# Patient Record
Sex: Male | Born: 1965 | Race: White | Hispanic: No | Marital: Single | Smoking: Never smoker
Health system: Southern US, Community
[De-identification: ages and names within clinical notes are randomized; demographics above are authoritative.]

## PROBLEM LIST (undated history)

## (undated) DIAGNOSIS — F32A Depression, unspecified: Secondary | ICD-10-CM

## (undated) DIAGNOSIS — F99 Mental disorder, not otherwise specified: Secondary | ICD-10-CM

## (undated) DIAGNOSIS — J9383 Other pneumothorax: Secondary | ICD-10-CM

## (undated) DIAGNOSIS — F329 Major depressive disorder, single episode, unspecified: Secondary | ICD-10-CM

## (undated) DIAGNOSIS — J9811 Atelectasis: Secondary | ICD-10-CM

## (undated) DIAGNOSIS — F419 Anxiety disorder, unspecified: Secondary | ICD-10-CM

## (undated) HISTORY — PX: LUNG SURGERY: SHX703

## (undated) HISTORY — DX: Other pneumothorax: J93.83

## (undated) HISTORY — DX: Atelectasis: J98.11

---

## 2000-05-26 ENCOUNTER — Emergency Department (HOSPITAL_COMMUNITY): Admission: EM | Admit: 2000-05-26 | Discharge: 2000-05-26 | Payer: Self-pay | Admitting: Emergency Medicine

## 2000-05-26 ENCOUNTER — Encounter: Payer: Self-pay | Admitting: Emergency Medicine

## 2001-07-10 ENCOUNTER — Encounter: Payer: Self-pay | Admitting: Emergency Medicine

## 2001-07-10 ENCOUNTER — Emergency Department (HOSPITAL_COMMUNITY): Admission: EM | Admit: 2001-07-10 | Discharge: 2001-07-10 | Payer: Self-pay | Admitting: Emergency Medicine

## 2001-07-11 ENCOUNTER — Ambulatory Visit (HOSPITAL_BASED_OUTPATIENT_CLINIC_OR_DEPARTMENT_OTHER): Admission: RE | Admit: 2001-07-11 | Discharge: 2001-07-12 | Payer: Self-pay | Admitting: *Deleted

## 2005-01-13 ENCOUNTER — Emergency Department (HOSPITAL_COMMUNITY): Admission: EM | Admit: 2005-01-13 | Discharge: 2005-01-14 | Payer: Self-pay | Admitting: Emergency Medicine

## 2005-02-13 ENCOUNTER — Emergency Department (HOSPITAL_COMMUNITY): Admission: EM | Admit: 2005-02-13 | Discharge: 2005-02-13 | Payer: Self-pay | Admitting: Emergency Medicine

## 2005-03-16 ENCOUNTER — Emergency Department (HOSPITAL_COMMUNITY): Admission: EM | Admit: 2005-03-16 | Discharge: 2005-03-16 | Payer: Self-pay | Admitting: Emergency Medicine

## 2005-03-20 ENCOUNTER — Ambulatory Visit (HOSPITAL_COMMUNITY): Admission: RE | Admit: 2005-03-20 | Discharge: 2005-03-20 | Payer: Self-pay | Admitting: Urology

## 2005-03-24 ENCOUNTER — Emergency Department (HOSPITAL_COMMUNITY): Admission: EM | Admit: 2005-03-24 | Discharge: 2005-03-24 | Payer: Self-pay | Admitting: Emergency Medicine

## 2005-06-03 ENCOUNTER — Emergency Department (HOSPITAL_COMMUNITY): Admission: EM | Admit: 2005-06-03 | Discharge: 2005-06-03 | Payer: Self-pay | Admitting: Emergency Medicine

## 2006-01-13 ENCOUNTER — Emergency Department (HOSPITAL_COMMUNITY): Admission: EM | Admit: 2006-01-13 | Discharge: 2006-01-13 | Payer: Self-pay | Admitting: Emergency Medicine

## 2007-11-24 IMAGING — CT CT PELVIS W/O CM
3 of 4 series · 14 of 32 positions shown, 19 images · IV contrast (agent unspecified)
Comparison: 01/13/05.

CLINICAL DATA: Abdominopelvic pain and left flank pain.  History of urinary calculi. 
ABDOMEN CT WITHOUT CONTRAST:
TECHNIQUE: Multidetector CT imaging of the abdomen was performed following the standard protocol without IV contrast.
TECHNIQUE: Multidetector CT imaging of the pelvis was performed following the standard protocol without IV contrast.

[Series 2: routine abdomen · axial · 0.70mm/px · z∈[-453,-223]mm · 4 of 78 slices shown, 9 images]
[im 16/78  soft-tissue]
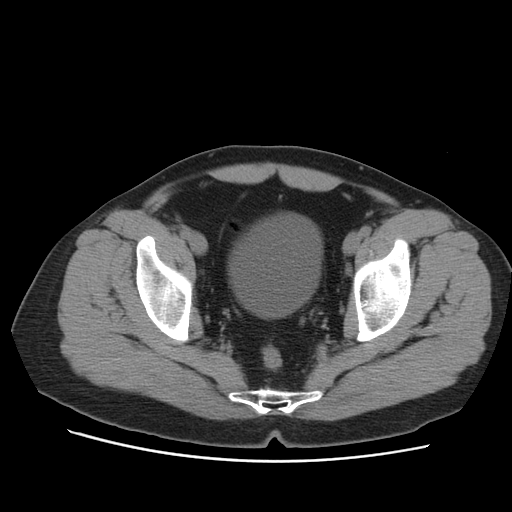
[im 16/78  lung]
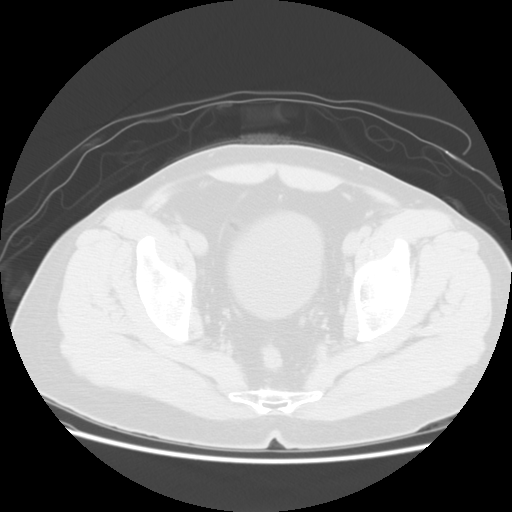
[im 16/78  bone]
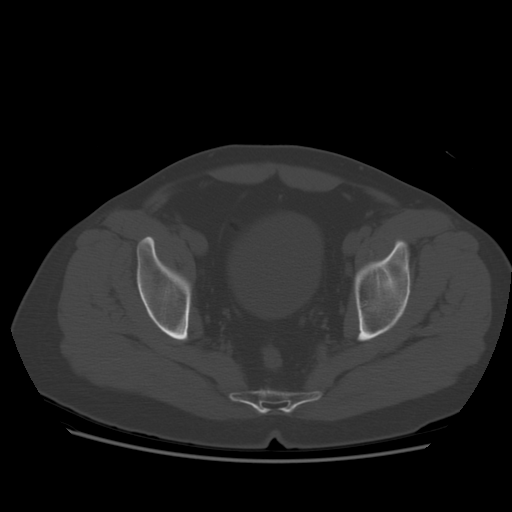
[im 31/78  soft-tissue]
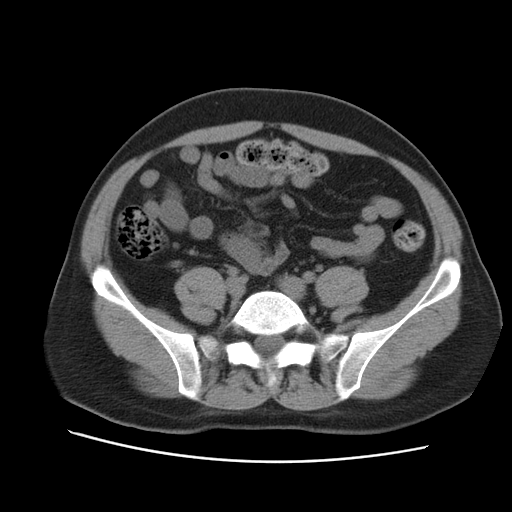
[im 31/78  lung]
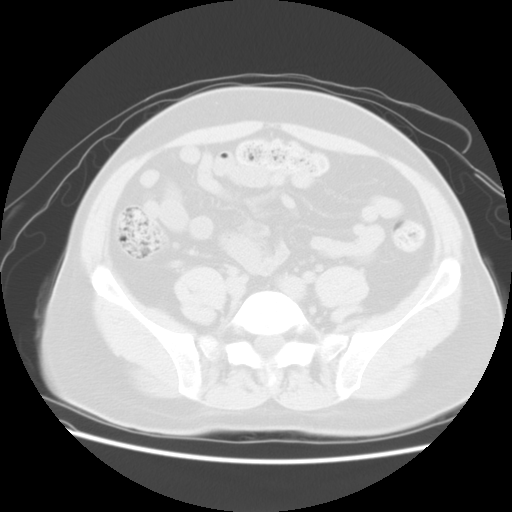
[im 47/78  soft-tissue]
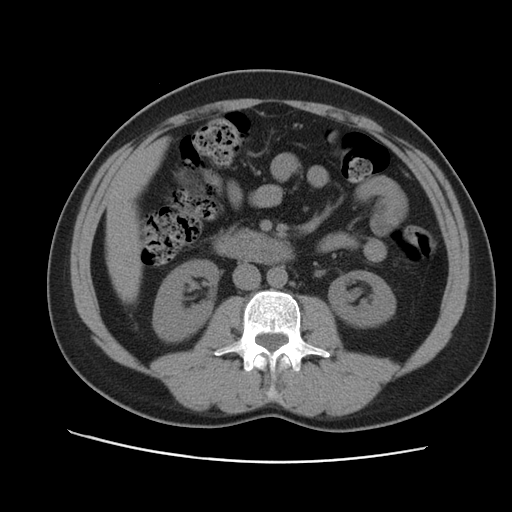
[im 47/78  lung]
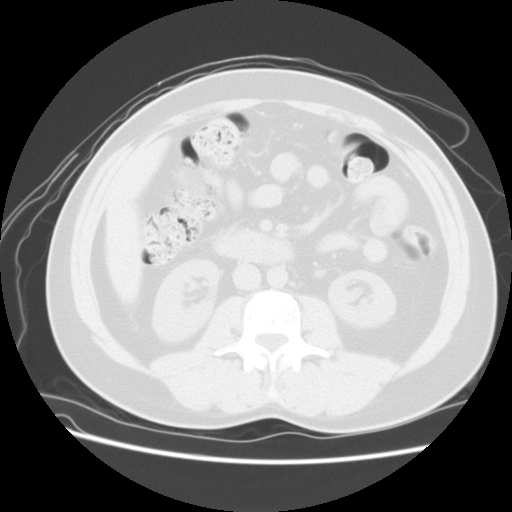
[im 62/78  soft-tissue]
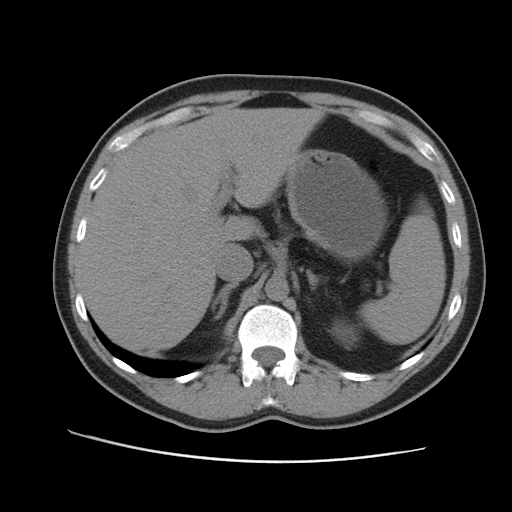
[im 62/78  lung]
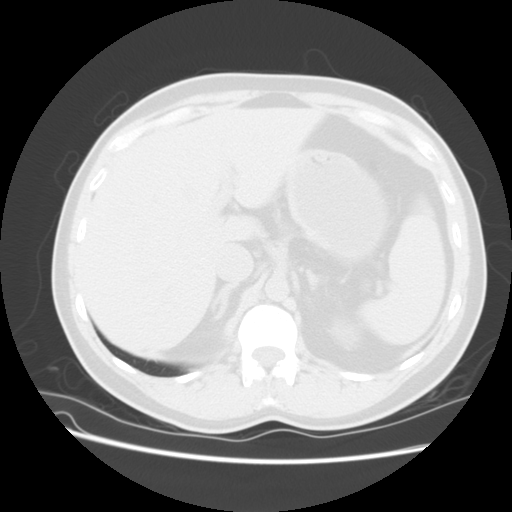

[Series 103: reformatted · sagittal · 0.80mm/px · 8 of 163 slices shown (1 of 2)]
[im 14/163  soft-tissue]
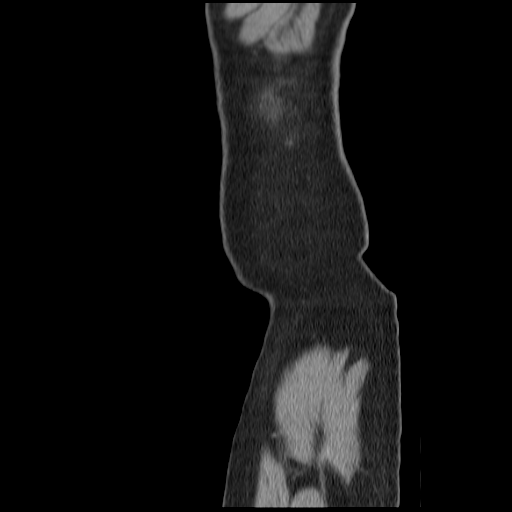
[im 41/163  soft-tissue]
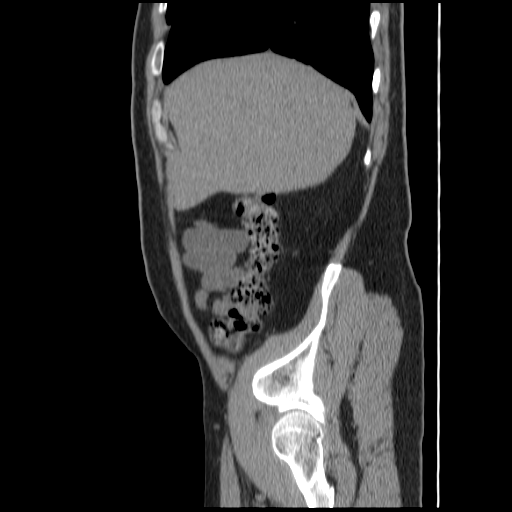
[im 55/163  soft-tissue]
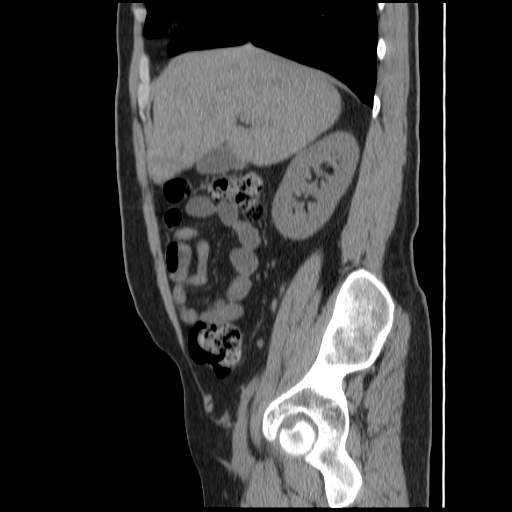
[im 68/163  soft-tissue]
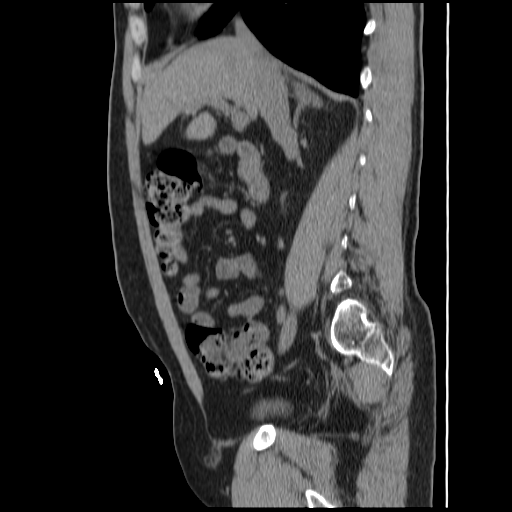
[im 95/163  soft-tissue]
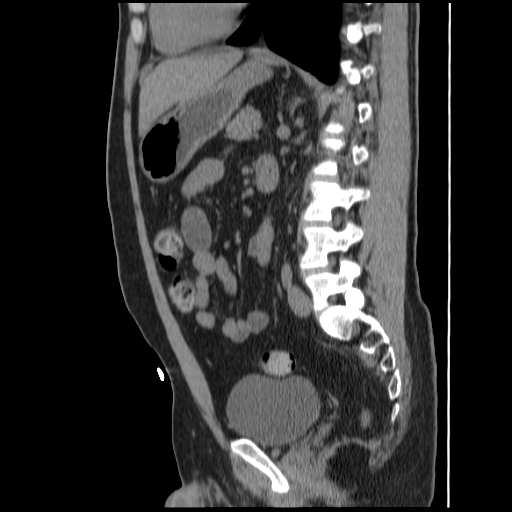
[im 109/163  soft-tissue]
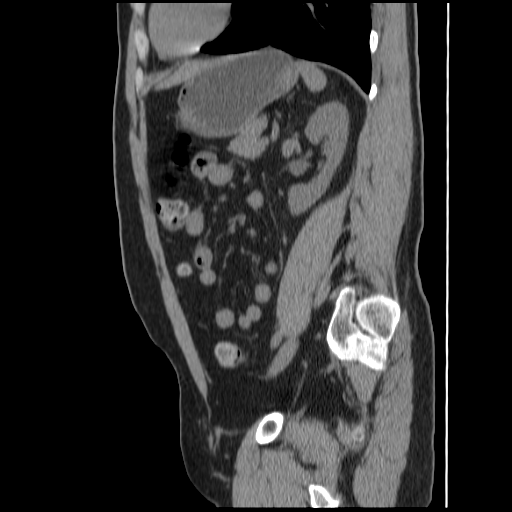
[im 122/163  soft-tissue]
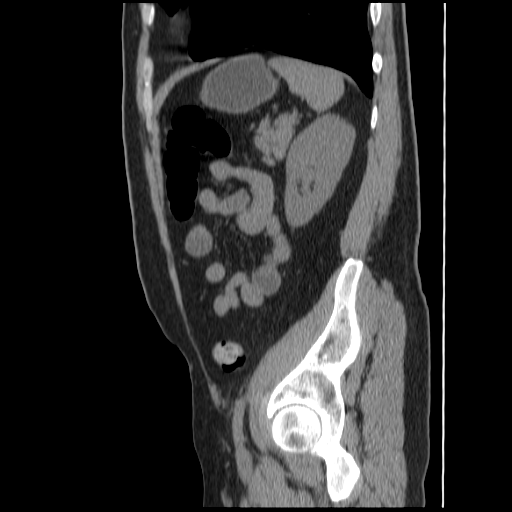
[im 149/163  soft-tissue]
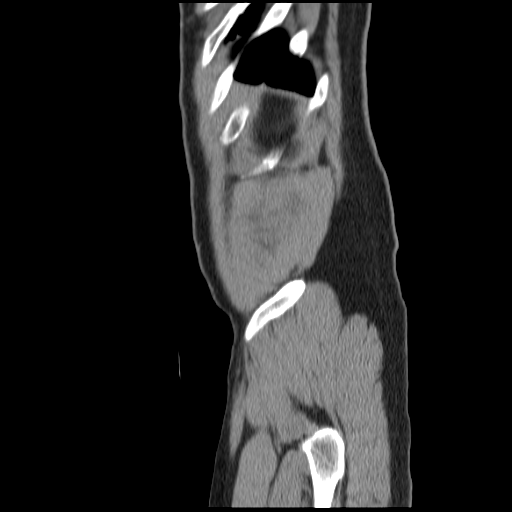

[Series 104: reformatted · coronal · 0.80mm/px · 2 of 138 slices shown (2 of 2)]
[im 14/138  soft-tissue]
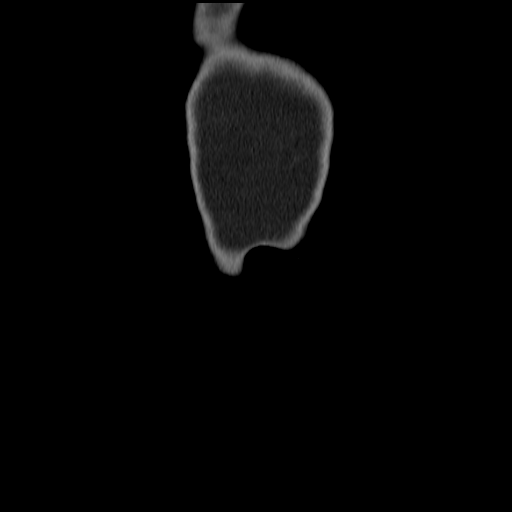
[im 28/138  soft-tissue]
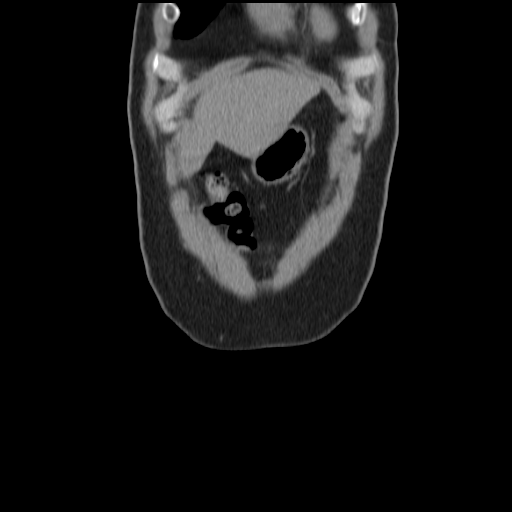

[14 of 32 positions shown; findings below may reference images not displayed]

FINDINGS: Hepatic cysts are unchanged, and the remainder of the liver is unremarkable.  The spleen, pancreas, adrenal glands, gallbladder, and right kidney are unremarkable.  Slightly increased mild left hydronephrosis is caused by a 3.5-mm calculus in the mid/distal left ureter just distal to the iliac crossover. This calculus has progressed distally from the left UPJ since the last study.  The remainder of the left kidney is unremarkable.  No free fluid, enlarged lymph nodes, abdominal aortic aneurysm, or biliary dilatation.  The appendix and bowel are unremarkable.
IMPRESSION: 3.5-mm mid/distal left ureteral calculus, progressed distally from the left UPJ since the last study, with increasing mild left hydronephrosis.
PELVIS CT WITHOUT CONTRAST:
FINDINGS: 3.5-mm mid/distal left ureteral calculus just distal to the iliac crossover noted.  No other significant abnormalities at present.  No free fluid or enlarged lymph nodes.  The bladder and bowel are within normal limits.
IMPRESSION: 3.5-mm mid/distal left ureteral calculus causing mild left hydronephrosis.

## 2009-01-04 ENCOUNTER — Ambulatory Visit: Payer: Self-pay | Admitting: Emergency Medicine

## 2009-01-04 DIAGNOSIS — J939 Pneumothorax, unspecified: Secondary | ICD-10-CM | POA: Insufficient documentation

## 2009-01-04 DIAGNOSIS — J930 Spontaneous tension pneumothorax: Secondary | ICD-10-CM

## 2009-01-04 DIAGNOSIS — R079 Chest pain, unspecified: Secondary | ICD-10-CM | POA: Insufficient documentation

## 2009-01-04 DIAGNOSIS — J9819 Other pulmonary collapse: Secondary | ICD-10-CM | POA: Insufficient documentation

## 2009-01-10 ENCOUNTER — Telehealth: Payer: Self-pay | Admitting: Emergency Medicine

## 2009-01-21 ENCOUNTER — Telehealth: Payer: Self-pay | Admitting: Emergency Medicine

## 2010-03-30 ENCOUNTER — Encounter: Payer: Self-pay | Admitting: Emergency Medicine

## 2010-04-11 ENCOUNTER — Ambulatory Visit (HOSPITAL_COMMUNITY)
Admission: RE | Admit: 2010-04-11 | Discharge: 2010-04-11 | Disposition: A | Discharge status: Home / Self Care | Payer: 59 | Source: Ambulatory Visit | Attending: Psychiatry | Admitting: Psychiatry

## 2010-04-11 DIAGNOSIS — F410 Panic disorder [episodic paroxysmal anxiety] without agoraphobia: Secondary | ICD-10-CM | POA: Insufficient documentation

## 2010-04-15 ENCOUNTER — Other Ambulatory Visit (HOSPITAL_COMMUNITY): Payer: 59 | Attending: Psychiatry | Admitting: Psychiatry

## 2010-04-15 DIAGNOSIS — F429 Obsessive-compulsive disorder, unspecified: Secondary | ICD-10-CM | POA: Insufficient documentation

## 2010-04-15 DIAGNOSIS — K219 Gastro-esophageal reflux disease without esophagitis: Secondary | ICD-10-CM | POA: Insufficient documentation

## 2010-04-15 DIAGNOSIS — F410 Panic disorder [episodic paroxysmal anxiety] without agoraphobia: Secondary | ICD-10-CM | POA: Insufficient documentation

## 2010-04-15 DIAGNOSIS — K589 Irritable bowel syndrome without diarrhea: Secondary | ICD-10-CM | POA: Insufficient documentation

## 2010-04-15 DIAGNOSIS — H409 Unspecified glaucoma: Secondary | ICD-10-CM | POA: Insufficient documentation

## 2010-04-15 DIAGNOSIS — F319 Bipolar disorder, unspecified: Secondary | ICD-10-CM

## 2010-04-16 ENCOUNTER — Other Ambulatory Visit (HOSPITAL_COMMUNITY): Payer: 59 | Admitting: Psychiatry

## 2010-04-17 ENCOUNTER — Other Ambulatory Visit (HOSPITAL_COMMUNITY): Payer: 59 | Admitting: Psychiatry

## 2010-04-18 ENCOUNTER — Other Ambulatory Visit (HOSPITAL_COMMUNITY): Payer: 59 | Admitting: Psychiatry

## 2010-04-21 ENCOUNTER — Other Ambulatory Visit (HOSPITAL_COMMUNITY): Payer: 59 | Admitting: Psychiatry

## 2010-04-22 ENCOUNTER — Other Ambulatory Visit (HOSPITAL_COMMUNITY): Payer: 59 | Admitting: Psychiatry

## 2010-04-23 ENCOUNTER — Other Ambulatory Visit (HOSPITAL_COMMUNITY): Payer: 59 | Admitting: Psychiatry

## 2010-04-24 ENCOUNTER — Other Ambulatory Visit (HOSPITAL_COMMUNITY): Payer: 59 | Admitting: Psychiatry

## 2010-04-25 ENCOUNTER — Other Ambulatory Visit (HOSPITAL_COMMUNITY): Payer: 59 | Admitting: Psychiatry

## 2010-04-28 ENCOUNTER — Other Ambulatory Visit (HOSPITAL_COMMUNITY): Payer: 59 | Admitting: Psychiatry

## 2010-04-29 ENCOUNTER — Other Ambulatory Visit (HOSPITAL_COMMUNITY): Payer: 59 | Admitting: Psychiatry

## 2010-04-30 ENCOUNTER — Other Ambulatory Visit (HOSPITAL_COMMUNITY): Payer: 59 | Admitting: Psychiatry

## 2010-05-01 ENCOUNTER — Other Ambulatory Visit (HOSPITAL_COMMUNITY): Payer: 59 | Admitting: Psychiatry

## 2010-05-02 ENCOUNTER — Other Ambulatory Visit (HOSPITAL_COMMUNITY): Payer: 59 | Admitting: Psychiatry

## 2010-05-05 ENCOUNTER — Other Ambulatory Visit (HOSPITAL_COMMUNITY): Payer: 59 | Admitting: Psychiatry

## 2010-07-25 NOTE — Op Note (Signed)
[place]. [facility]  Patient:    BRASON, BERTHELOT Visit Number: [id number] MRN: [id number]          Service Type: EMS Location: ED Attending Physician:  Hanley Seamen Dictated by:   Lowell Bouton, M.D. Proc. Date: 07/11/01 Admit Date:  07/10/2001 Discharge Date: 07/10/2001                             Operative Report  PREOPERATIVE DIAGNOSIS:  Comminuted intra-articular fracture, right distal radius.  POSTOPERATIVE DIAGNOSIS:  Comminuted intra-articular fracture, right distal radius.  OPERATION PERFORMED:  Open reduction internal fixation, right distal radius fracture.  SURGEON:  Lowell Bouton, M.D.  ANESTHESIA:  Axillary block.  OPERATIVE FINDINGS:  The patient had significant dorsal comminution with intra-articular component volarly in the sagittal plane.  The radial tilt had been flattened and there was a neutral dorsal tilt.  DESCRIPTION OF PROCEDURE:  Under axillary block anesthesia, with a tourniquet on the right arm, the right hand was prepped and draped in the usual fashion and after exsanguinating the limb, the tourniquet was inflated to 250 mHg.  A volar approach was made to the distal radius with a longitudinal incision along the flexor carpi radialis tendon.  The incision was obliqued across the volar wrist crease.  Sharp dissection was carried through the subcutaneous tissues and bleeding points were coagulated.  Sharp dissection was carried through the FCR sheath and the FCR tendon was retracted ulnarly.  The floor of the sheath was opened longitudinally with the scissors.  There was significant pressure beneath the fascia.  This was released proximally and distally down to the pronator quadratus. That had been torn off the radial attachment of the radius.  It was elevated ulnarly.  The fracture was exposed and 10 pounds of longitudinal traction had been applied across the radial three digits with finger  traps.  The fracture was irrigated with saline.  There was a large radial styloid fragment that was comminuted and then there was an ulnar fragment that was fairly large.  These two fragments were reduced volarly. The styloid fragments was pinned temporarily with a 45 K-wire to hold the reduction.  A DVR plate was placed volarly on the distal radius and x-rays showed good alignment of the fracture.  The plate was applied by putting a 3.5 screw in proximally followed by four nonthreaded 2.5 pegs.  This corrected the dorsal tilt.  The remaining three 3.5 screws were then inserted proximally and the K-wire was removed.  The traction had been removed prior to inserting the pegs.  X-ray showed good alignment of the fracture.  Under fluoroscopy there was no further subluxation of the carpus.  There was stability of the fracture fragments.  The wound was irrigated with saline.  The vessel loop drain was left in for drainage.  The pronator quadratus was reattached with 4-0 Vicryl and the subcutaneous tissues were closed with 4-0 Vicryl.  The skin was closed with a 3-0 subcuticular Prolene.  Steri-Strips were applied followed by sterile dressings and a sugar-tong splint.  The patient tolerated the procedure well and went to the recovery room awake and stable in good condition. Dictated by:   Lowell Bouton, M.D. Attending Physician:  Hanley Seamen DD:  07/11/01 TD:  07/12/01 Job: [id number]

## 2010-07-25 NOTE — Op Note (Signed)
NAME:  Peter Gardner, Peter Gardner              ACCOUNT NO.:  [id number]   MEDICAL RECORD NO.:  [id number]          PATIENT TYPE:  AMB   LOCATION:  DAY                          FACILITY:  [facility]   PHYSICIAN:  Sigmund I. Patsi Sears, M.D.DATE OF BIRTH:  [birth year]   DATE OF PROCEDURE:  03/20/2005  DATE OF DISCHARGE:                                 OPERATIVE REPORT   PREOPERATIVE DIAGNOSIS:  Impacted left EV junction stone 3.7 mm.   POSTOPERATIVE DIAGNOSIS:  Impacted left EV junction stone 3.7 mm.   OPERATION:  Cystourethroscopy, left retrograde pyelogram with  interpretation, balloon dilation of left lower ureter, ureteroscopy with  basket extraction of left ureteral stone, left double-J catheter.   SURGEON:  Dr. Patsi Sears   ANESTHESIA:  General LMA.   PREOPERATIVE PREPARATION:  Appropriate preanesthesia, the patient is brought  to the operating room and placed on the operating room in the dorsal supine  position where general LMA anesthesia was introduced.  He was then re-placed  in dorsal lithotomy position where the pubis was prepped with Betadine  solution and draped in usual fashion.   REVIEW OF HISTORY:  This 45 year old male has a history of recurrent left  ureteral colic, and currently presents with crescendo colic for basket  extraction of stone this afternoon.   PROCEDURE:  Cystourethroscopy was accomplished, and the left ureteral  orifice was noted to be markedly edematous and deformed.  The 5-French open-  ended catheter is manipulated into the orifice, but a wire could not be  passed around the stone.  Retrograde pyelogram is performed, shows  obstruction of the ureter.   The ureteroscope was passed into the ureter, and the stone was pushed up  proximalward.  An area of impaction of the UV junction is edematous,  erythematous, freely bleeding, and very thin-walled from chronic reaction.   A guidewire is then passed into the kidney.  The ureteroscope was removed,  and the  cystoscope again placed, and 4-cm balloon dilator is placed, and the  lower ureter and orifice are balloon dilated for 3 minutes to 14 atmospheres  pressure.  Following this, repeat ureteroscopy was accomplished; the stone  is identified and the basket is placed.  However, the stone would not come  out.  The stone was shaped in an arrow or comma-shaped and was particularly  difficult to entrap in the basket.  It was elected to place the laser, and  the basket was cut and  clamped.  While preparing the laser, the basket with  the stone entrapped fell out, with the stone in the basket.  Therefore, the  laser was not used.   Repeat ureteroscopy was accomplished and showed that there was bleeding from  the lower ureter, but the mid ureter upper ureter appeared to be normal.  It  was elected to leave a double-J catheter, and the ureteroscope was replaced  with a cystoscope.  A 5-French ureteral catheter was then placed [facility]), under fluoroscopic control.  The patient tolerated the  procedure well.  Stone was taken.  He was awakened and taken to the recovery  room in  good condition.  He received IV Toradol.      Sigmund I. Patsi Sears, M.D.  Electronically Signed     SIT/MEDQ  D:  03/20/2005  T:  03/21/2005  Job:  [id number]

## 2010-12-08 ENCOUNTER — Ambulatory Visit (INDEPENDENT_AMBULATORY_CARE_PROVIDER_SITE_OTHER)
Admission: RE | Admit: 2010-12-08 | Discharge: 2010-12-08 | Disposition: A | Discharge status: Home / Self Care | Payer: Managed Care, Other (non HMO) | Source: Ambulatory Visit | Attending: Adult Health | Admitting: Adult Health

## 2010-12-08 ENCOUNTER — Ambulatory Visit (INDEPENDENT_AMBULATORY_CARE_PROVIDER_SITE_OTHER): Payer: Managed Care, Other (non HMO) | Admitting: Adult Health

## 2010-12-08 ENCOUNTER — Encounter: Payer: Self-pay | Admitting: Adult Health

## 2010-12-08 VITALS — BP 114/70 | HR 79 | Temp 97.9°F | Ht 68.0 in | Wt 139.0 lb

## 2010-12-08 DIAGNOSIS — R079 Chest pain, unspecified: Secondary | ICD-10-CM

## 2010-12-08 DIAGNOSIS — Z8709 Personal history of other diseases of the respiratory system: Secondary | ICD-10-CM

## 2010-12-08 NOTE — Patient Instructions (Signed)
Warm heat to ribs.  Motrin 200mg  3-4  tabs  Three times a day  With food for 5 days  Gas x As needed   Please contact office for sooner follow up if symptoms do not improve or worsen or seek emergency care  Follow up Dr. Delton Coombes in 6 weeks  And As needed

## 2010-12-09 NOTE — Progress Notes (Signed)
Subjective:    Patient ID: Peter Gardner, male    DOB: [birth year], 45 y.o.   MRN: [id number]  HPI 45 yo never smoker, has a hx of recurrent  bilateral pneumothoraces at age 57 that  ultimately required bilateral pleurodesis. Seen for initial pulmonary consult by Dr. Delton Coombes 10/ 2010.   12/2008 Initial pulmonary consult   hx of bilateral pneumothoraces at age 67 , ultimately required bilateral pleurodesis. Since then he has had other occurances of spont PTX, never requiring a repeat chest tube. Over the last few yrs he has been experiencing intermittant dull chest aching and pressure. Cardiac eval reassuring, doesn't feel like GERDOver the last month he has had more frequent L sided deep pain, like someone pushing on his chest. Not associated with cough, other specific activities. CXR 9/10 without any recurrance PTX, does show tenting of the R hemidiaphragm. No obvious calcifications  >>CT chest neg for acute process, neg for PE. Chronic changes of bilateral pleurodesis. Alpha 1 done was neg (MM)   Acute OV 12/08/2010 Pt c/o left side CP/Pressure in chest x 1 wk. States that the pain feels as if he has another pneumothorax.  Pt states "pain doubles me over" . Pt complains that since his surgery he has had recurent left sided chest pain along previous healed surgical scars. Today xray in the office was neg for acute process, no sign of Pneumothorax. He has no cough/fever or congestion  No exertional chest pain or palpitation.no syncope. Tenderness along left lateral ribs.  Has used some old vicodin with some relief.     Review of Systems Constitutional:   No  weight loss, night sweats,  Fevers, chills, fatigue, or  lassitude.  HEENT:   No headaches,  Difficulty swallowing,  Tooth/dental problems, or  Sore throat,                No sneezing, itching, ear ache, nasal congestion, post nasal drip,   CV:  No chest pain,  Orthopnea, PND, swelling in lower extremities, anasarca, dizziness,  palpitations, syncope.   GI  No heartburn, indigestion, abdominal pain, nausea, vomiting, diarrhea, change in bowel habits, loss of appetite, bloody stools.   Resp: No shortness of breath with exertion or at rest.  No excess mucus, no productive cough,  No non-productive cough,  No coughing up of blood.  No change in color of mucus.  No wheezing.  No chest wall deformity  Skin: no rash or lesions.  GU: no dysuria, change in color of urine, no urgency or frequency.  No flank pain, no hematuria   MS:  No joint pain or swelling.  No decreased range of motion.  No back pain.  Psych:  No change in mood or affect. No depression or anxiety.  No memory loss.         Objective:   Physical Exam GEN: A/Ox3; pleasant , NAD, well nourished   HEENT:  [place]/AT,  EACs-clear, TMs-wnl, NOSE-clear, THROAT-clear, no lesions, no postnasal drip or exudate noted.   NECK:  Supple w/ fair ROM; no JVD; normal carotid impulses w/o bruits; no thyromegaly or nodules palpated; no lymphadenopathy.  RESP  Clear  P & A; w/o, wheezes/ rales/ or rhonchi.no accessory muscle use, no dullness to percussion , along left lateral chest wall with well healed surgical scars, tenderness to touch, no redness or rash. No deformity noted.   CARD:  RRR, no m/r/g  , no peripheral edema, pulses intact, no cyanosis or clubbing.  GI:  Soft & nt; nml bowel sounds; no organomegaly or masses detected.  Musco: Warm bil, no deformities or joint swelling noted.   Neuro: alert, no focal deficits noted.    Skin: Warm, no lesions or rashes    CXR 12/08/10  Postop changes in both lung apices. Mild hyperinflation  noted with chronic basilar scarring. No airspace disease,  collapse, consolidation, edema, pneumonia, effusion, or  pneumothorax. Midline trachea. Normal heart size and vascularity.        Assessment & Plan:

## 2010-12-09 NOTE — Assessment & Plan Note (Signed)
Recurrent chronic chest wall pain from previous pleurodesis for recurrent pnuemothoraces  Xray with no evidence of pneumothrorax  Suspect this is related to chronic pleural scarring +/- surgical scar tissue Advised to use heat, nsaids As needed   If continues might consider neurontin for chronic pain

## 2011-01-09 ENCOUNTER — Encounter: Payer: Self-pay | Admitting: Emergency Medicine

## 2011-01-09 ENCOUNTER — Inpatient Hospital Stay (INDEPENDENT_AMBULATORY_CARE_PROVIDER_SITE_OTHER)
Admission: RE | Admit: 2011-01-09 | Discharge: 2011-01-09 | Disposition: A | Discharge status: Home / Self Care | Payer: Managed Care, Other (non HMO) | Source: Ambulatory Visit | Attending: Emergency Medicine | Admitting: Emergency Medicine

## 2011-01-09 DIAGNOSIS — N509 Disorder of male genital organs, unspecified: Secondary | ICD-10-CM

## 2011-01-12 ENCOUNTER — Ambulatory Visit
Admission: RE | Admit: 2011-01-12 | Discharge: 2011-01-12 | Disposition: A | Discharge status: Home / Self Care | Payer: Managed Care, Other (non HMO) | Source: Ambulatory Visit | Attending: Emergency Medicine | Admitting: Emergency Medicine

## 2011-01-12 ENCOUNTER — Other Ambulatory Visit: Payer: Self-pay | Admitting: Emergency Medicine

## 2011-01-12 DIAGNOSIS — N50812 Left testicular pain: Secondary | ICD-10-CM

## 2011-01-14 ENCOUNTER — Telehealth (INDEPENDENT_AMBULATORY_CARE_PROVIDER_SITE_OTHER): Payer: Self-pay | Admitting: Emergency Medicine

## 2011-02-09 NOTE — Telephone Encounter (Signed)
  Phone Note Call from Patient Call back at Home Phone [phone]   Caller: Patient Summary of Call: Patient having more pain, sent him to [facility], [phone], He has a small cyst and he needs furthur evaluation.

## 2011-02-09 NOTE — Progress Notes (Signed)
Summary: Left Testicular Pain Room 4   Vital Signs:  Patient Profile:   45 Years Old Male CC:      Left testicular pain x 1 month, denies new sexual partners, no discharge Height:     69 inches (175.26 cm) Weight:      138 pounds O2 Sat:      100 % O2 treatment:    Room Air Temp:     99.4 degrees F oral Pulse rate:   70 / minute Pulse rhythm:   regular Resp:     16 per minute BP sitting:   109 / 71  (left arm) Cuff size:   regular  Pt. in pain?   yes    Location:   left testicle    Intensity:   2    Type:       aching  Vitals Entered By: Emilio Math (January 09, 2011 5:01 PM)                   Current Allergies: ! PCNHistory of Present Illness Chief Complaint: Left testicular pain x 1 month, denies new sexual partners, no discharge History of Present Illness: L testicular pain for almost a month.  Same monogamous sexual partner for 15 years.  No dysuria, discharge, back pain.  Pain is soreness, constant, mild.  No F/C/N/V.  No other symptoms noted.  No swelling.  No relieving measures tried.  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  His dad did have a hernia.  REVIEW OF SYSTEMS Constitutional Symptoms      Denies fever, chills, night sweats, weight loss, weight gain, and fatigue.  Eyes       Denies change in vision, eye pain, eye discharge, glasses, contact lenses, and eye surgery. Ear/Nose/Throat/Mouth       Denies hearing loss/aids, change in hearing, ear pain, ear discharge, dizziness, frequent runny nose, frequent nose bleeds, sinus problems, sore throat, hoarseness, and tooth pain or bleeding.  Respiratory       Denies dry cough, productive cough, wheezing, shortness of breath, asthma, bronchitis, and emphysema/COPD.  Cardiovascular       Denies murmurs, chest pain, and tires easily with exhertion.    Gastrointestinal       Denies stomach pain, nausea/vomiting, diarrhea, constipation, blood in bowel movements, and  indigestion. Genitourniary       Denies painful urination, kidney stones, and loss of urinary control.      Comments: Left testicular pain Neurological       Denies paralysis, seizures, and fainting/blackouts. Musculoskeletal       Denies muscle pain, joint pain, joint stiffness, decreased range of motion, redness, swelling, muscle weakness, and gout.  Skin       Denies bruising, unusual mles/lumps or sores, and hair/skin or nail changes.  Psych       Denies mood changes, temper/anger issues, anxiety/stress, speech problems, depression, and sleep problems.  Past History:  Past Medical History: Reviewed history from 01/04/2009 and no changes required. Current Problems:  CHEST PAIN (ICD-786.50) Hx of ATELECTASIS (ICD-518.0)  Past Surgical History: Left lung surgery in 1984 Right lung surgery in 1985  Family History: Reviewed history from 01/04/2009 and no changes required. Family History MI/Heart Attack 00 father, MGM, MGF Family History C V A / Stroke --PGF Alzheimers -- PGM Heart disease -- mother and father ? 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 MSE: oriented to time, place, and person Assessment New Problems: TESTICULAR PAIN (ICD-608.9)   Plan New Medications/Changes: CIPROFLOXACIN HCL 500 MG TABS (CIPROFLOXACIN HCL) 1 by mouth two times a day for 8 days  #16 x 0, 01/09/2011, Hoyt Koch MD  New Orders: New Patient Level III [phone] Planning Comments:   DDx includes epidydimitis/orchitis -- will treat with Cipro + OTC antiinflammatory, ice, elevation.  Consider neoplasm, varicocele, etc -- to order an U/S with doppler.  I don't see any red flags to say this is testicular torsion, especially with a month of mild pain.  Need to  consider cancer with history of undescended L testicle.  Patient understand and agrees to this course.  ER precautions explained for severe pain.  I also don't believe this is urinary, STD, kidney stone, etc, but may want to consider checking if not improving.  Also may want to consider indirect L inguinal hernia.  If all tests negative and not improving, will likely send to urology for more definitive workup.   The patient and/or caregiver has been counseled thoroughly with regard to medications prescribed including dosage, schedule, interactions, rationale for use, and possible side effects and they verbalize understanding.  Diagnoses and expected course of recovery discussed and will return if not improved as expected or if the condition worsens. Patient and/or caregiver verbalized understanding.  Prescriptions: CIPROFLOXACIN HCL 500 MG TABS (CIPROFLOXACIN HCL) 1 by mouth two times a day for 8 days  #16 x 0   Entered and Authorized by:   Hoyt Koch MD   Signed by:   Hoyt Koch MD on 01/09/2011   Method used:   Print then Give to Patient   RxID:   [id number]   Orders Added: 1)  New Patient Level III [96295]

## 2012-03-28 ENCOUNTER — Encounter (HOSPITAL_COMMUNITY): Payer: Self-pay | Admitting: *Deleted

## 2012-03-28 ENCOUNTER — Ambulatory Visit (HOSPITAL_COMMUNITY)
Admission: RE | Admit: 2012-03-28 | Discharge: 2012-03-28 | Disposition: A | Discharge status: Home / Self Care | Payer: Self-pay | Attending: Psychiatry | Admitting: Psychiatry

## 2012-03-28 DIAGNOSIS — F411 Generalized anxiety disorder: Secondary | ICD-10-CM | POA: Insufficient documentation

## 2012-03-28 HISTORY — DX: Anxiety disorder, unspecified: F41.9

## 2012-03-28 HISTORY — DX: Mental disorder, not otherwise specified: F99

## 2012-03-28 HISTORY — DX: Major depressive disorder, single episode, unspecified: F32.9

## 2012-03-28 HISTORY — DX: Depression, unspecified: F32.A

## 2012-03-28 NOTE — H&P (Signed)
Behavioral Health Medical Screening Exam  Peter Gardner is an 47 y.o. male. Who presents to [facility] as a walk in. The patient states that he is having acutely exacerbated anxiety sx with occasional panic attacks. Pt rates his anxiety level a 7/10. Pt is on Xanax .5 mg daily. Pt denies any depressive sx i.e. Insomnia, anhedonia, racing thoughts, mood swings, SI, HI, or AVH. Pt states he can contract for safety. Pt does endorse some feeling of isolation recently.  Review of Systems  Constitutional: Negative.   HENT: Negative.   Eyes: Negative.   Respiratory: Negative.   Cardiovascular: Negative.   Gastrointestinal: Negative.   Genitourinary: Negative.   Musculoskeletal: Negative.   Skin: Negative.   Neurological: Negative.   Endo/Heme/Allergies: Negative.   Psychiatric/Behavioral: The patient is nervous/anxious.     Physical Exam  Constitutional: He appears well-developed and well-nourished.  HENT:  Head: Normocephalic.  Eyes: Pupils are equal, round, and reactive to light.  Neck: Normal range of motion. Neck supple. No thyromegaly present.  Cardiovascular: Normal rate and regular rhythm.   No murmur heard. Respiratory: Effort normal and breath sounds normal.  GI: Soft.  Musculoskeletal: He exhibits no edema.  Lymphadenopathy:    He has no cervical adenopathy.  Neurological: He is alert. No cranial nerve deficit.  Skin: Skin is warm and dry.  Psychiatric:       Anxious appearing with appropriate affect    There were no vitals taken for this visit.  Recommendations:  Based on my evaluation the patient does not appear to have an emergency medical condition. Pt was given out patient referral for further mgmt of Generalized Anxiety Disorder and Panic attacks. Pt also see Dr Evelene Croon as an out patient and has an appointment for tomorrow.  Ladell Bey E 03/28/2012, 8:13 PM

## 2012-03-28 NOTE — BH Assessment (Signed)
Assessment Note   Peter Gardner is an 47 y.o. male. Pt presents with increased anxiety and depression. Pt reports daily panic attacks that seem to have gotten worse over the past couple of weeks. Pt reports having anxiety and panic attacks for years. Pt reports that he just started back working in 10/13 and has since changed employers. Pt reports that he started his new job in December 2014 and already feels like he cant take it. Pt reports feeling extremely stressed and anxious when working and has had difficulty maintaining a job due to his anxiety. Pt reports that the root of his anxiety stems from him being "gay". Pt reports that he is a [place] [employer]ist, Peter Gardner is "saved", and does not want to be "gay". Pt reports that is has been keeping his sexuality a secret and reports that his family does not know that he is "gay", and he does not have any intention on telling his family as he feels that he does not want to hurt their feelings. Pt reports that he isolates himself from his family so they wont find out that he is "gay".  Pt reports daily panic attacks as a result of the stress r/t  The secret that he is keeping from his family for the past 16 years. Pt reports that he hates his life and explains that "you have no idea" of hard it is to keep such a secret. Pt reports that he has been thinking about leaving an traveling as far [place] as he can to escape his problems. Pt denies SI,HI, and no AVH reported. Consulted with AC Peter Gardner and PA Peter Gardner who recommends that pt continue follow-up with his current provider. Pt reports that he has an appt. with his psychiatrist tommorrow at 6pm. Pt was offered IOP at [facility] and pt declined as he reports that he tried prior and it did not help. Pt was referred to [facility] IOP evening program. Pt agreed that he would try this program and was given resources to contact [facility]. Pt was also given additional crisis and opt resources as needed.      Axis I: Generalized Anxiety Disorder Axis II: Deferred Axis III:  Past Medical History  Diagnosis Date  . Recurrent spontaneous pneumothorax   . Atelectasis   . Mental disorder   . Anxiety   . Depression    Axis IV: other psychosocial or environmental problems and problems related to social environment Axis V: 51-60 moderate symptoms  Past Medical History:  Past Medical History  Diagnosis Date  . Recurrent spontaneous pneumothorax   . Atelectasis   . Mental disorder   . Anxiety   . Depression     Past Surgical History  Procedure Date  . Lung surgery     x 2 left lung 1984 and right lung in 1985    Family History:  Family History  Problem Relation Age of Onset  . Heart attack      Social History:  reports that he has never smoked. He has never used smokeless tobacco. He reports that he does not drink alcohol or use illicit drugs.  Additional Social History:  Alcohol / Drug Use Pain Medications:  (None Reported) Prescriptions:  (Nexium and Xanax) Over the Counter:  (Tums) History of alcohol / drug use?: No history of alcohol / drug abuse  CIWA:   COWS:    Allergies:  Allergies  Allergen Reactions  . Penicillins     REACTION: hives, rash  Home Medications:  (Not in a hospital admission)  OB/GYN Status:  No LMP for male patient.  General Assessment Data Location of Assessment: [facility] Living Arrangements: Other (Comment) (Lives with partner and partner's 8 y/o son) Can pt return to current living arrangement?: Yes Admission Status: Voluntary Is patient capable of signing voluntary admission?: Yes Transfer from: Home Referral Source: Self/Family/Friend     Risk to self Suicidal Ideation: No Suicidal Intent: No Is patient at risk for suicide?: No Suicidal Plan?: No Access to Means: No What has been your use of drugs/alcohol within the last 12 months?: none reported Previous Attempts/Gestures: No (reports passive  thoughts in the past,would never act on thou) How many times?: 0  Other Self Harm Risks: none reported Triggers for Past Attempts: None known Intentional Self Injurious Behavior: None Family Suicide History: No (family hx of depression(niece,sister,mom)) Recent stressful life event(s): Conflict (Comment);Other (Comment) (has been keeping his sexuality a secret from family ) Persecutory voices/beliefs?: No Depression: Yes Depression Symptoms: Insomnia;Isolating;Fatigue;Guilt;Feeling worthless/self pity Substance abuse history and/or treatment for substance abuse?: No Suicide prevention information given to non-admitted patients: Yes  Risk to Others Homicidal Ideation: No Thoughts of Harm to Others: No Current Homicidal Intent: No Current Homicidal Plan: No Access to Homicidal Means: No Identified Victim: na History of harm to others?: No Assessment of Violence: None Noted Violent Behavior Description: Cooperative,Anxious Does patient have access to weapons?: No Criminal Charges Pending?: No Does patient have a court date: No  Psychosis Hallucinations: None noted Delusions: None noted  Mental Status Report Appear/Hygiene: Other (Comment) (Appropriate,Neat, Casual) Eye Contact: Fair Motor Activity: Freedom of movement Speech: Logical/coherent Level of Consciousness: Alert Mood: Anxious;Ambivalent Affect: Anxious;Appropriate to circumstance Anxiety Level: Panic Attacks Panic attack frequency: daily panic attacks for years Most recent panic attack: 03/28/12 Thought Processes: Coherent;Relevant;Circumstantial Judgement: Unimpaired Orientation: Person;Place;Time;Situation Obsessive Compulsive Thoughts/Behaviors: None  Cognitive Functioning Concentration: Decreased Memory: Recent Intact;Remote Intact IQ: Average Insight: Good Impulse Control: Fair Appetite: Good Weight Loss: 0  Weight Gain: 0  Sleep: No Change (varies) Total Hours of Sleep:  ( unable to specify,but  notes he just wants to sleeps a lot) Vegetative Symptoms: None  ADLScreening [facility] Assessment Services) Patient's cognitive ability adequate to safely complete daily activities?: Yes Patient able to express need for assistance with ADLs?: Yes Independently performs ADLs?: Yes (appropriate for developmental age)  Abuse/Neglect [facility]) Physical Abuse: Denies Verbal Abuse: Denies Sexual Abuse: Denies  Prior Inpatient Therapy Prior Inpatient Therapy: No Prior Therapy Dates: na Prior Therapy Facilty/Provider(s): na Reason for Treatment: na  Prior Outpatient Therapy Prior Outpatient Therapy: Yes Prior Therapy Dates: 2001-Psychiatry-Meds,2011-Counselor-"did not work out", Current Provider for past 21yrs-Dr. Evelene Croon Prior Therapy Facilty/Provider(s): Dr. Evelene Croon, Psychiatrist in [place]?? Reason for Treatment: Medication,Anxiety,Depression  ADL Screening (condition at time of admission) Patient's cognitive ability adequate to safely complete daily activities?: Yes Patient able to express need for assistance with ADLs?: Yes Independently performs ADLs?: Yes (appropriate for developmental age) Weakness of Legs: None Weakness of Arms/Hands: None  Home Assistive Devices/Equipment Home Assistive Devices/Equipment: None (no heavy lifting d/t lung collapse in 1984)    Abuse/Neglect Assessment (Assessment to be complete while patient is alone) Physical Abuse: Denies Verbal Abuse: Denies Sexual Abuse: Denies Exploitation of patient/patient's resources: Denies Self-Neglect: Denies     Merchant navy officer (For Healthcare) Advance Directive: Patient does not have advance directive;Patient would not like information Nutrition Screen- [facility] Adult/[facility] Have you recently lost weight without trying?: No Have you been eating poorly because of a decreased appetite?:  No Malnutrition Screening Tool Score: 0   Additional Information 1:1 In Past 12 Months?: No CIRT Risk: No Elopement Risk: No Does patient have  medical clearance?: No     Disposition:  Disposition Disposition of Patient: Treatment offered and refused Type of outpatient treatment: Adult (Referred to Current Provider,OP referrals given,IOP offered ) Type of treatment offered and refused: Intensive outpatient ( referred to eveing IOP at [facility],declined IOP at [facility])  On Site Evaluation by:   Reviewed with Physician:     Bjorn Pippin 03/28/2012 8:15 PM

## 2015-05-14 ENCOUNTER — Ambulatory Visit (HOSPITAL_COMMUNITY): Payer: Self-pay | Admitting: Psychology

## 2015-05-28 ENCOUNTER — Ambulatory Visit (HOSPITAL_COMMUNITY): Payer: Self-pay | Admitting: Psychology

## 2016-06-28 ENCOUNTER — Emergency Department (HOSPITAL_COMMUNITY): Payer: Medicaid Other

## 2016-06-28 ENCOUNTER — Emergency Department (HOSPITAL_COMMUNITY)
Admission: EM | Admit: 2016-06-28 | Discharge: 2016-06-29 | Disposition: A | Discharge status: Home / Self Care | Payer: Medicaid Other | Attending: Emergency Medicine | Admitting: Emergency Medicine

## 2016-06-28 ENCOUNTER — Encounter (HOSPITAL_COMMUNITY): Payer: Self-pay | Admitting: Emergency Medicine

## 2016-06-28 DIAGNOSIS — M542 Cervicalgia: Secondary | ICD-10-CM | POA: Diagnosis not present

## 2016-06-28 LAB — I-STAT CHEM 8, ED
BUN: 17 mg/dL (ref 6–20)
CALCIUM ION: 1.1 mmol/L — AB (ref 1.15–1.40)
CREATININE: 1.1 mg/dL (ref 0.61–1.24)
Chloride: 102 mmol/L (ref 101–111)
Glucose, Bld: 104 mg/dL — ABNORMAL HIGH (ref 65–99)
HCT: 43 % (ref 39.0–52.0)
Hemoglobin: 14.6 g/dL (ref 13.0–17.0)
Potassium: 3.8 mmol/L (ref 3.5–5.1)
Sodium: 139 mmol/L (ref 135–145)
TCO2: 28 mmol/L (ref 0–100)

## 2016-06-28 MED ORDER — IOPAMIDOL (ISOVUE-370) INJECTION 76%
100.0000 mL | Freq: Once | INTRAVENOUS | Status: AC | PRN
  Administered 2016-06-28: 100 mL via INTRAVENOUS

## 2016-06-28 MED ORDER — IOPAMIDOL (ISOVUE-370) INJECTION 76%
INTRAVENOUS | Status: AC
  Filled 2016-06-28: qty 100

## 2016-06-28 NOTE — ED Triage Notes (Signed)
[place] and had severe pain on left side of neck. Patient called EMS out and was seen and told that could be aneurysm in left jugular. Patient states that he couldn't swallow earlier due to pain in neck when trying to swallow. patinet reports that pain has eased off some.
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
## 2016-06-29 MED ORDER — METHOCARBAMOL 500 MG PO TABS: 500.0000 mg | ORAL_TABLET | Freq: Two times a day (BID) | ORAL | 0 refills | Status: DC

## 2016-06-29 MED ORDER — IBUPROFEN 800 MG PO TABS: 800.0000 mg | ORAL_TABLET | Freq: Three times a day (TID) | ORAL | 0 refills | Status: DC

## 2016-06-29 NOTE — Discharge Instructions (Signed)
1. Medications: ibuprofen and robaxin, usual home medications 2. Treatment: rest, drink plenty of fluids, gentle stretching of your neck 3. Follow Up: Please followup with your primary doctor in 2-3 days for discussion of your diagnoses and further evaluation after today's visit; if you do not have a primary care doctor use the resource guide provided to find one; Please return to the ER for return of pain, headaches, fever or other concerns

## 2022-02-24 DIAGNOSIS — J208 Acute bronchitis due to other specified organisms: Secondary | ICD-10-CM | POA: Diagnosis not present

## 2022-02-24 DIAGNOSIS — B9689 Other specified bacterial agents as the cause of diseases classified elsewhere: Secondary | ICD-10-CM | POA: Diagnosis not present

## 2022-02-24 DIAGNOSIS — J019 Acute sinusitis, unspecified: Secondary | ICD-10-CM | POA: Diagnosis not present

## 2022-03-20 DIAGNOSIS — Z8709 Personal history of other diseases of the respiratory system: Secondary | ICD-10-CM | POA: Diagnosis not present

## 2022-03-20 DIAGNOSIS — K7689 Other specified diseases of liver: Secondary | ICD-10-CM | POA: Diagnosis not present

## 2022-03-20 DIAGNOSIS — Z8249 Family history of ischemic heart disease and other diseases of the circulatory system: Secondary | ICD-10-CM | POA: Diagnosis not present

## 2022-03-20 DIAGNOSIS — J3089 Other allergic rhinitis: Secondary | ICD-10-CM | POA: Diagnosis not present

## 2022-03-20 DIAGNOSIS — E785 Hyperlipidemia, unspecified: Secondary | ICD-10-CM | POA: Diagnosis not present

## 2022-03-20 DIAGNOSIS — J329 Chronic sinusitis, unspecified: Secondary | ICD-10-CM | POA: Diagnosis not present

## 2022-03-20 DIAGNOSIS — R7309 Other abnormal glucose: Secondary | ICD-10-CM | POA: Diagnosis not present

## 2022-03-20 DIAGNOSIS — F411 Generalized anxiety disorder: Secondary | ICD-10-CM | POA: Diagnosis not present

## 2022-04-23 DIAGNOSIS — E785 Hyperlipidemia, unspecified: Secondary | ICD-10-CM | POA: Diagnosis not present

## 2022-04-23 DIAGNOSIS — R002 Palpitations: Secondary | ICD-10-CM | POA: Diagnosis not present

## 2022-04-23 DIAGNOSIS — R7303 Prediabetes: Secondary | ICD-10-CM | POA: Diagnosis not present

## 2022-04-23 DIAGNOSIS — Z8249 Family history of ischemic heart disease and other diseases of the circulatory system: Secondary | ICD-10-CM | POA: Diagnosis not present

## 2022-04-23 DIAGNOSIS — R000 Tachycardia, unspecified: Secondary | ICD-10-CM | POA: Diagnosis not present

## 2022-08-18 DIAGNOSIS — J3489 Other specified disorders of nose and nasal sinuses: Secondary | ICD-10-CM | POA: Diagnosis not present

## 2022-08-18 DIAGNOSIS — R748 Abnormal levels of other serum enzymes: Secondary | ICD-10-CM | POA: Diagnosis not present

## 2022-08-18 DIAGNOSIS — K7689 Other specified diseases of liver: Secondary | ICD-10-CM | POA: Diagnosis not present

## 2023-01-11 DIAGNOSIS — E782 Mixed hyperlipidemia: Secondary | ICD-10-CM | POA: Diagnosis not present

## 2023-01-11 DIAGNOSIS — R7303 Prediabetes: Secondary | ICD-10-CM | POA: Diagnosis not present

## 2023-01-11 DIAGNOSIS — R748 Abnormal levels of other serum enzymes: Secondary | ICD-10-CM | POA: Diagnosis not present

## 2023-01-15 DIAGNOSIS — R748 Abnormal levels of other serum enzymes: Secondary | ICD-10-CM | POA: Diagnosis not present

## 2023-01-15 DIAGNOSIS — R7303 Prediabetes: Secondary | ICD-10-CM | POA: Diagnosis not present

## 2023-01-15 DIAGNOSIS — K7689 Other specified diseases of liver: Secondary | ICD-10-CM | POA: Diagnosis not present

## 2023-01-15 DIAGNOSIS — E785 Hyperlipidemia, unspecified: Secondary | ICD-10-CM | POA: Diagnosis not present

## 2023-07-29 ENCOUNTER — Encounter: Payer: Self-pay | Admitting: Internal Medicine

## 2023-07-29 ENCOUNTER — Ambulatory Visit (INDEPENDENT_AMBULATORY_CARE_PROVIDER_SITE_OTHER): Payer: Medicare (Managed Care) | Admitting: Internal Medicine

## 2023-07-29 VITALS — BP 110/78 | HR 105 | Temp 98.9°F | Ht 68.0 in | Wt 168.3 lb

## 2023-07-29 DIAGNOSIS — Z8249 Family history of ischemic heart disease and other diseases of the circulatory system: Secondary | ICD-10-CM

## 2023-07-29 DIAGNOSIS — F411 Generalized anxiety disorder: Secondary | ICD-10-CM

## 2023-07-29 DIAGNOSIS — E782 Mixed hyperlipidemia: Secondary | ICD-10-CM | POA: Diagnosis not present

## 2023-07-29 MED ORDER — AZELASTINE HCL 137 MCG/SPRAY NA SOLN: 1.0000 | Freq: Two times a day (BID) | NASAL | 3 refills | Status: AC

## 2023-07-29 NOTE — Progress Notes (Signed)
 New Patient Office Visit     CC/Reason for Visit: Establish care, discuss chronic conditions Previous PCP: Dutch Ginger, MD Last Visit: November/2024  HPI: Peter Gardner is a 58 y.o. male who is coming in today for the above mentioned reasons. Past Medical History is significant for: Hyperlipidemia, generalized anxiety disorder on Ativan and Lexapro followed by psychiatry.  He has benign hepatic cysts evaluated by GI and no further surveillance required.  His colonoscopy was in 2022 and was advised to 10-year follow-up.  He has no concerns or complaints.  He has a very strong family history of premature coronary artery disease in parents, grandparents, siblings.  He has already seen a cardiologist through [facility].  Will obtain records.   Past Medical/Surgical History: Past Medical History:  Diagnosis Date   Anxiety    Atelectasis    Depression    Mental disorder    Recurrent spontaneous pneumothorax     Past Surgical History:  Procedure Laterality Date   LUNG SURGERY     x 2 left lung 1984 and right lung in 1985    Social History:  reports that he has never smoked. He has never used smokeless tobacco. He reports that he does not drink alcohol and does not use drugs.  Allergies: Allergies  Allergen Reactions   Penicillins     REACTION: hives, rash    Family History:  Family History  Problem Relation Age of Onset   Heart attack Unknown      Current Outpatient Medications:    aspirin EC 81 MG tablet, Take 81 mg by mouth once., Disp: , Rfl:    atorvastatin (LIPITOR) 40 MG tablet, Take 40 mg by mouth daily., Disp: , Rfl:    escitalopram (LEXAPRO) 20 MG tablet, Take 20 mg by mouth 2 (two) times daily., Disp: , Rfl:    fluticasone (FLONASE) 50 MCG/ACT nasal spray, Place 1 spray into the nose., Disp: , Rfl:    LORazepam (ATIVAN) 1 MG tablet, Take 1 mg by mouth 2 (two) times daily., Disp: , Rfl:    Azelastine HCl 137 MCG/SPRAY SOLN, Place 1 spray into both nostrils 2  (two) times daily., Disp: 30 mL, Rfl: 3  Review of Systems:  Negative except as indicated in HPI.   Physical Exam: Vitals:   07/29/23 1305  BP: 110/78  Pulse: (!) 105  Temp: 98.9 F (37.2 C)  TempSrc: Oral  SpO2: 99%  Weight: 168 lb 4.8 oz (76.3 kg)  Height: 5\' 8"  (1.727 m)   Body mass index is 25.59 kg/m.  Physical Exam Vitals reviewed.  Constitutional:      Appearance: Normal appearance.  HENT:     Head: Normocephalic and atraumatic.  Eyes:     Conjunctiva/sclera: Conjunctivae normal.  Cardiovascular:     Rate and Rhythm: Normal rate and regular rhythm.  Pulmonary:     Effort: Pulmonary effort is normal.     Breath sounds: Normal breath sounds.  Skin:    General: Skin is warm and dry.  Neurological:     General: No focal deficit present.     Mental Status: He is alert and oriented to person, place, and time.  Psychiatric:        Mood and Affect: Mood normal.        Behavior: Behavior normal.        Thought Content: Thought content normal.        Judgment: Judgment normal.        Impression and  Plan:  GAD (generalized anxiety disorder)  Mixed hyperlipidemia  Family history of premature CAD  Other orders -     Azelastine HCl; Place 1 spray into both nostrils 2 (two) times daily.  Dispense: 30 mL; Refill: 3   - Azelastine nasal spray refilled for seasonal allergies. - Continue statin therapy. - Will continue under psychiatric care for his GAD.  Time spent: 33 minutes reviewing chart, interviewing and examining patient and formulating plan of care.       Marguerita Shih, MD [facility]

## 2023-10-25 ENCOUNTER — Ambulatory Visit: Admitting: Internal Medicine

## 2023-11-16 ENCOUNTER — Ambulatory Visit: Payer: Medicare (Managed Care) | Admitting: Family Medicine

## 2023-11-25 ENCOUNTER — Telehealth: Payer: Self-pay | Admitting: *Deleted

## 2023-11-25 NOTE — Telephone Encounter (Signed)
 Patient is aware.

## 2023-11-25 NOTE — Telephone Encounter (Signed)
 Copied from CRM [id number]. Topic: Clinical - Medication Question >> Nov 25, 2023  8:07 AM Berneda FALCON wrote: Reason for CRM: Pt states he is having the same yearly sinus infection and would like to know if PCP can call in antibiotic for him?  Appt offered but he stated he already knows what it is and would like to see if she can just call something in for him instead.  Symptoms: yellow mucus, drainage, pain under eyes X5 days.  Pharmacy: [facility] [address] [place] - [address] [place] [address] Phone: [phone] Fax: [phone] Hours: Not open 24 hours   Patient callback is [phone] (home)

## 2024-01-27 ENCOUNTER — Ambulatory Visit: Payer: Medicare (Managed Care) | Admitting: Internal Medicine

## 2024-01-27 ENCOUNTER — Encounter: Payer: Self-pay | Admitting: Internal Medicine

## 2024-01-27 VITALS — BP 110/74 | HR 94 | Temp 98.4°F | Wt 160.8 lb

## 2024-01-27 DIAGNOSIS — F411 Generalized anxiety disorder: Secondary | ICD-10-CM

## 2024-01-27 DIAGNOSIS — E782 Mixed hyperlipidemia: Secondary | ICD-10-CM

## 2024-01-27 LAB — COMPREHENSIVE METABOLIC PANEL WITH GFR
ALT: 24 U/L (ref 0–53)
AST: 25 U/L (ref 0–37)
Albumin: 4.4 g/dL (ref 3.5–5.2)
Alkaline Phosphatase: 119 U/L — ABNORMAL HIGH (ref 39–117)
BUN: 11 mg/dL (ref 6–23)
CO2: 31 meq/L (ref 19–32)
Calcium: 9.5 mg/dL (ref 8.4–10.5)
Chloride: 100 meq/L (ref 96–112)
Creatinine, Ser: 0.95 mg/dL (ref 0.40–1.50)
GFR: 88.44 mL/min (ref >60.00)
Glucose, Bld: 102 mg/dL — ABNORMAL HIGH (ref 70–99)
Potassium: 3.9 meq/L (ref 3.5–5.1)
Sodium: 138 meq/L (ref 135–145)
Total Bilirubin: 0.8 mg/dL (ref 0.2–1.2)
Total Protein: 6.9 g/dL (ref 6.0–8.3)

## 2024-01-27 LAB — LIPID PANEL
Cholesterol: 106 mg/dL (ref 0–200)
HDL: 54.4 mg/dL (ref >39.00)
LDL Cholesterol: 32 mg/dL (ref 0–99)
NonHDL: 51.32
Total CHOL/HDL Ratio: 2
Triglycerides: 96 mg/dL (ref 0.0–149.0)
VLDL: 19.2 mg/dL (ref 0.0–40.0)

## 2024-01-27 NOTE — Progress Notes (Signed)
 Established Patient Office Visit     CC/Reason for Visit: Follow-up chronic conditions  HPI: Peter Gardner is a 58 y.o. male who is coming in today for the above mentioned reasons. Past Medical History is significant for: Generalized anxiety disorder on Lexapro and lorazepam, seasonal allergies and hyperlipidemia on atorvastatin 40 mg.  Feeling well, no acute concerns or complaints.   Past Medical/Surgical History: Past Medical History:  Diagnosis Date   Anxiety    Atelectasis    Depression    Mental disorder    Recurrent spontaneous pneumothorax     Past Surgical History:  Procedure Laterality Date   LUNG SURGERY     x 2 left lung 1984 and right lung in 1985    Social History:  reports that he has never smoked. He has never used smokeless tobacco. He reports that he does not drink alcohol and does not use drugs.  Allergies: Allergies  Allergen Reactions   Penicillins     REACTION: hives, rash    Family History:  Family History  Problem Relation Age of Onset   Heart attack Unknown      Current Outpatient Medications:    aspirin EC 81 MG tablet, Take 81 mg by mouth once., Disp: , Rfl:    atorvastatin (LIPITOR) 40 MG tablet, Take 40 mg by mouth daily., Disp: , Rfl:    Azelastine  HCl 137 MCG/SPRAY SOLN, Place 1 spray into both nostrils 2 (two) times daily., Disp: 30 mL, Rfl: 3   escitalopram (LEXAPRO) 20 MG tablet, Take 20 mg by mouth 2 (two) times daily., Disp: , Rfl:    fluticasone (FLONASE) 50 MCG/ACT nasal spray, Place 1 spray into the nose., Disp: , Rfl:    LORazepam (ATIVAN) 1 MG tablet, Take 1 mg by mouth 2 (two) times daily., Disp: , Rfl:   Review of Systems:  Negative unless indicated in HPI.   Physical Exam: Vitals:   01/27/24 1257  BP: 110/74  Pulse: 94  Temp: 98.4 F (36.9 C)  TempSrc: Oral  SpO2: 100%  Weight: 160 lb 12.8 oz (72.9 kg)    Body mass index is 24.45 kg/m.   Physical Exam Vitals reviewed.  Constitutional:       Appearance: Normal appearance.  HENT:     Head: Normocephalic and atraumatic.  Eyes:     Conjunctiva/sclera: Conjunctivae normal.  Cardiovascular:     Rate and Rhythm: Normal rate and regular rhythm.  Pulmonary:     Effort: Pulmonary effort is normal.     Breath sounds: Normal breath sounds.  Skin:    General: Skin is warm and dry.  Neurological:     General: No focal deficit present.     Mental Status: He is alert and oriented to person, place, and time.  Psychiatric:        Mood and Affect: Mood normal.        Behavior: Behavior normal.        Thought Content: Thought content normal.        Judgment: Judgment normal.      Impression and Plan:  GAD (generalized anxiety disorder)  Mixed hyperlipidemia -     Lipid panel; Future -     Comprehensive metabolic panel with GFR; Future   - Mood is stable on current medications. - Check lipids and CMP.  Time spent:31 minutes reviewing chart, interviewing and examining patient and formulating plan of care.     Tully Theophilus Andrews, MD [facility]  at [facility]

## 2024-02-01 ENCOUNTER — Ambulatory Visit: Payer: Self-pay | Admitting: Internal Medicine

## 2024-02-21 ENCOUNTER — Encounter: Payer: Self-pay | Admitting: Internal Medicine

## 2024-02-21 MED ORDER — ATORVASTATIN CALCIUM 40 MG PO TABS: 40.0000 mg | ORAL_TABLET | Freq: Every day | ORAL | 1 refills | Status: AC

## 2024-03-04 ENCOUNTER — Encounter: Payer: Self-pay | Admitting: Internal Medicine

## 2024-07-26 ENCOUNTER — Encounter: Payer: Medicare (Managed Care) | Admitting: Internal Medicine
